# Patient Record
Sex: Male | Born: 1985 | Race: White | Hispanic: No | Marital: Married | Smoking: Never smoker
Health system: Southern US, Community
[De-identification: ages and names within clinical notes are randomized; demographics above are authoritative.]

---

## 2020-12-21 ENCOUNTER — Emergency Department (HOSPITAL_COMMUNITY)
Admission: EM | Admit: 2020-12-21 | Discharge: 2020-12-21 | Disposition: A | Discharge status: Home / Self Care | Attending: Emergency Medicine | Admitting: Emergency Medicine

## 2020-12-21 ENCOUNTER — Other Ambulatory Visit: Payer: Self-pay

## 2020-12-21 ENCOUNTER — Encounter (HOSPITAL_COMMUNITY): Payer: Self-pay | Admitting: Emergency Medicine

## 2020-12-21 ENCOUNTER — Emergency Department (HOSPITAL_COMMUNITY)

## 2020-12-21 DIAGNOSIS — Y93B9 Activity, other involving muscle strengthening exercises: Secondary | ICD-10-CM | POA: Diagnosis not present

## 2020-12-21 DIAGNOSIS — X501XXA Overexertion from prolonged static or awkward postures, initial encounter: Secondary | ICD-10-CM | POA: Diagnosis not present

## 2020-12-21 DIAGNOSIS — S8991XA Unspecified injury of right lower leg, initial encounter: Secondary | ICD-10-CM | POA: Diagnosis present

## 2020-12-21 DIAGNOSIS — S8391XA Sprain of unspecified site of right knee, initial encounter: Secondary | ICD-10-CM | POA: Insufficient documentation

## 2020-12-21 NOTE — ED Provider Notes (Signed)
[facility] EMERGENCY DEPARTMENT Provider Note   CSN: [id number] Arrival date & time: 12/21/20  1003     History Chief Complaint  Patient presents with  . Knee Injury  . Knee Pain    Ryan Bell is a 35 y.o. male.  35 year old male presents with right knee pain.  Patient was doing a drill where he was dragging a heavy sled while walking backwards, stepped on something with his right foot causing him to twist his right knee/valgus stress of the knee resulting in pain around his patella.  Patient is able to bear weight with pain.  No prior injuries to this knee.  No other injuries, complaints, concerns.        History reviewed. No pertinent past medical history.  There are no problems to display for this patient.   History reviewed. No pertinent surgical history.     No family history on file.  Social History   Tobacco Use  . Smoking status: Never Smoker  . Smokeless tobacco: Never Used  Substance Use Topics  . Alcohol use: Not Currently  . Drug use: Not Currently    Home Medications Prior to Admission medications   Not on File    Allergies    Patient has no allergy information on record.  Review of Systems   Review of Systems  Constitutional: Negative for fever.  Musculoskeletal: Positive for arthralgias and myalgias. Negative for joint swelling.  Skin: Negative for color change, rash and wound.  Allergic/Immunologic: Negative for immunocompromised state.  Neurological: Negative for weakness and numbness.    Physical Exam Updated Vital Signs BP 127/83 (BP Location: Right Arm)   Pulse 97   Temp 98.3 F (36.8 C) (Oral)   Resp 16   SpO2 96%   Physical Exam Vitals and nursing note reviewed.  Constitutional:      General: He is not in acute distress.    Appearance: He is well-developed. He is not diaphoretic.  HENT:     Head: Normocephalic and atraumatic.  Cardiovascular:     Pulses: Normal pulses.  Pulmonary:      Effort: Pulmonary effort is normal.  Musculoskeletal:        General: Tenderness present. No swelling or deformity.     Comments: Tenderness with palpation superior and inferior to the right patella as well as somewhat along medial joint line, worse with extension of the knee.  No specific pain or laxity with varus or valgus stress.  Skin:    General: Skin is warm and dry.     Findings: No bruising, erythema or rash.  Neurological:     Mental Status: He is alert and oriented to person, place, and time.     Sensory: No sensory deficit.     Motor: No weakness.  Psychiatric:        Behavior: Behavior normal.     ED Results / Procedures / Treatments   Labs (all labs ordered are listed, but only abnormal results are displayed) Labs Reviewed - No data to display  EKG None  Radiology DG Knee Complete 4 Views Right  Result Date: 12/21/2020 CLINICAL DATA:  Buckling type injury during exercise EXAM: RIGHT KNEE - COMPLETE 4+ VIEW COMPARISON:  None. FINDINGS: Frontal, lateral, and bilateral oblique views obtained. No appreciable fracture or dislocation. No joint effusion. Joint spaces appear normal. No erosive change. IMPRESSION: No fracture, dislocation, or joint effusion. No evident arthropathy. Electronically Signed   By: Bretta Bang III M.D.  On: 12/21/2020 12:21    Procedures Procedures   Medications Ordered in ED Medications - No data to display  ED Course  I have reviewed the triage vital signs and the nursing notes.  Pertinent labs & imaging results that were available during my care of the patient were reviewed by me and considered in my medical decision making (see chart for details).  Clinical Course as of 12/21/20 1229  Sun Dec 21, 2020  [id number] 35 year old male with right knee injury.  On exam has tenderness inferior and superior to the right patella as well as medial knee.  X-ray right knee unremarkable.  Suspect right knee strain.  Referred to orthopedics for  follow-up, not from this area however will require local referral.  Given knee immobilizer, crutches weight-bear as tolerated.  Recommend ice, elevate, Motrin Tylenol. [LM]    Clinical Course User Index [LM] Alden Hipp   MDM Rules/Calculators/A&P                          Final Clinical Impression(s) / ED Diagnoses Final diagnoses:  Sprain of right knee, unspecified ligament, initial encounter    Rx / DC Orders ED Discharge Orders    None       Jeannie Fend, PA-C 12/21/20 1229    Melene Plan, DO 12/21/20 1347

## 2020-12-21 NOTE — ED Triage Notes (Signed)
Pt. Stated, I was going through some exercise training and my knee like buckled, able to bear weight but very uncomfortable.

## 2020-12-21 NOTE — Progress Notes (Signed)
Orthopedic Tech Progress Note Patient Details:  Ryan Bell 03-17-1986 [id number]  Ortho Devices Type of Ortho Device: Knee Immobilizer,Crutches Ortho Device/Splint Location: Right Lower Extremity Ortho Device/Splint Interventions: Ordered,Application,Adjustment   Post Interventions Patient Tolerated: Well Instructions Provided: Adjustment of device,Care of device,Poper ambulation with device   Ryan Bell P [name] 12/21/2020, 1:04 PM

## 2020-12-21 NOTE — Discharge Instructions (Addendum)
Wear knee brace.  Use crutches to weight-bear as tolerated. Apply ice to the knee for 20 minutes at a time and elevate to help with pain and swelling. Given referral to local orthopedist, follow-up with PCP if you need referral closer to home. Take Motrin and Tylenol as needed as directed for pain.

## 2022-06-06 IMAGING — DX DG KNEE COMPLETE 4+V*R*
5 series · 5 of 5 positions shown · non-contrast
Comparison: None.

CLINICAL DATA: Buckling type injury during exercise

EXAM:
RIGHT KNEE - COMPLETE 4+ VIEW

[knee ap]
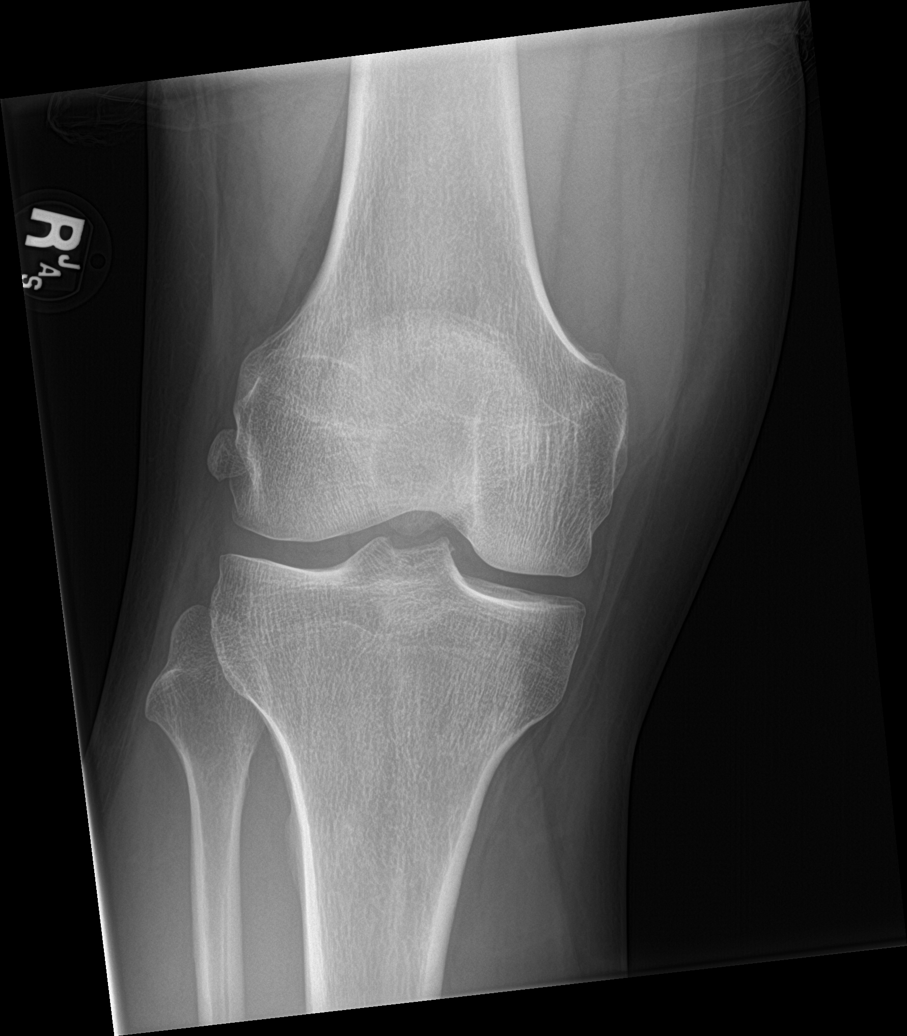

[knee lat]
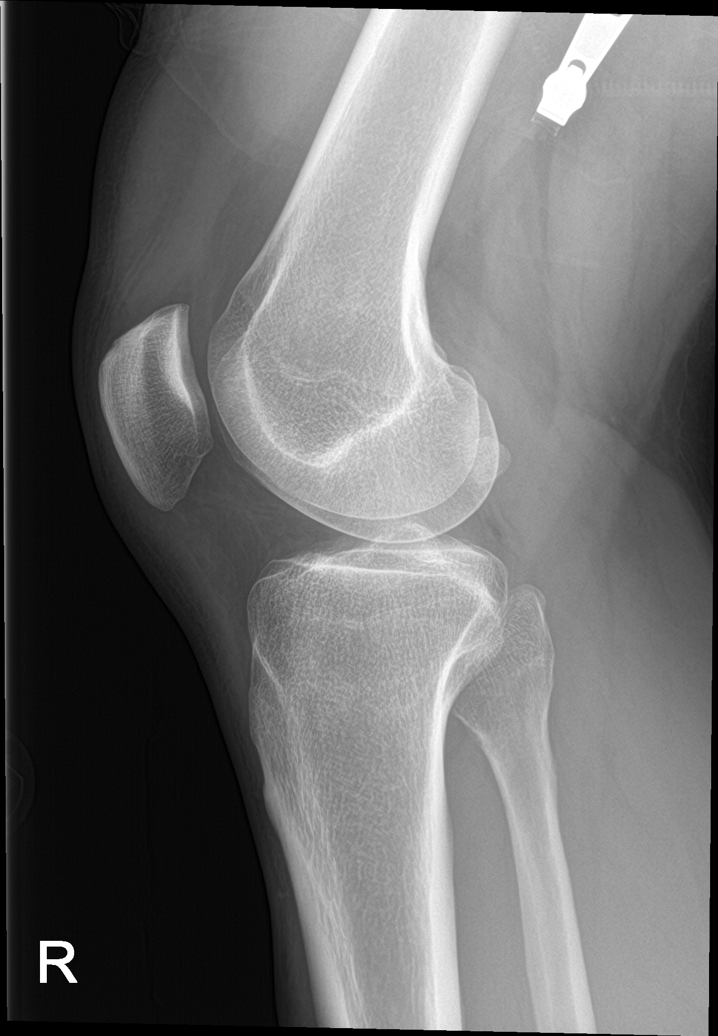

[knee obl (1 of 3)]
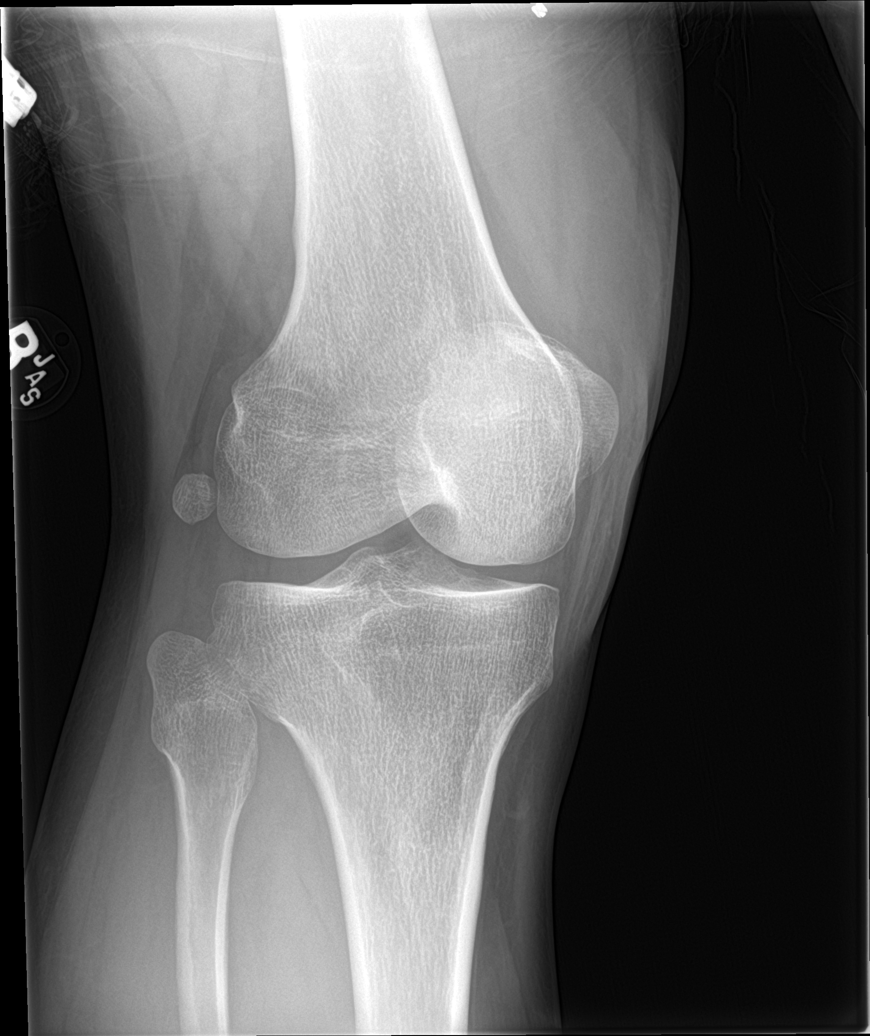

[knee obl (2 of 3)]
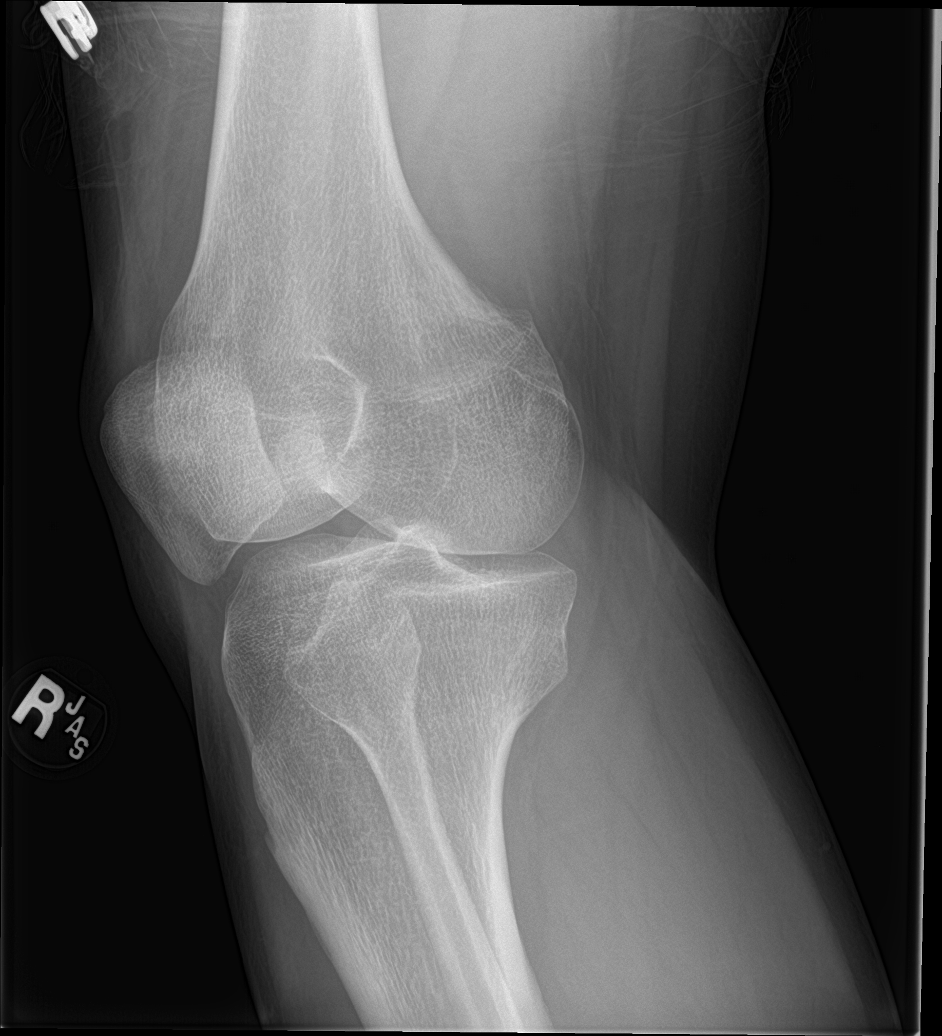

[knee obl (3 of 3)]
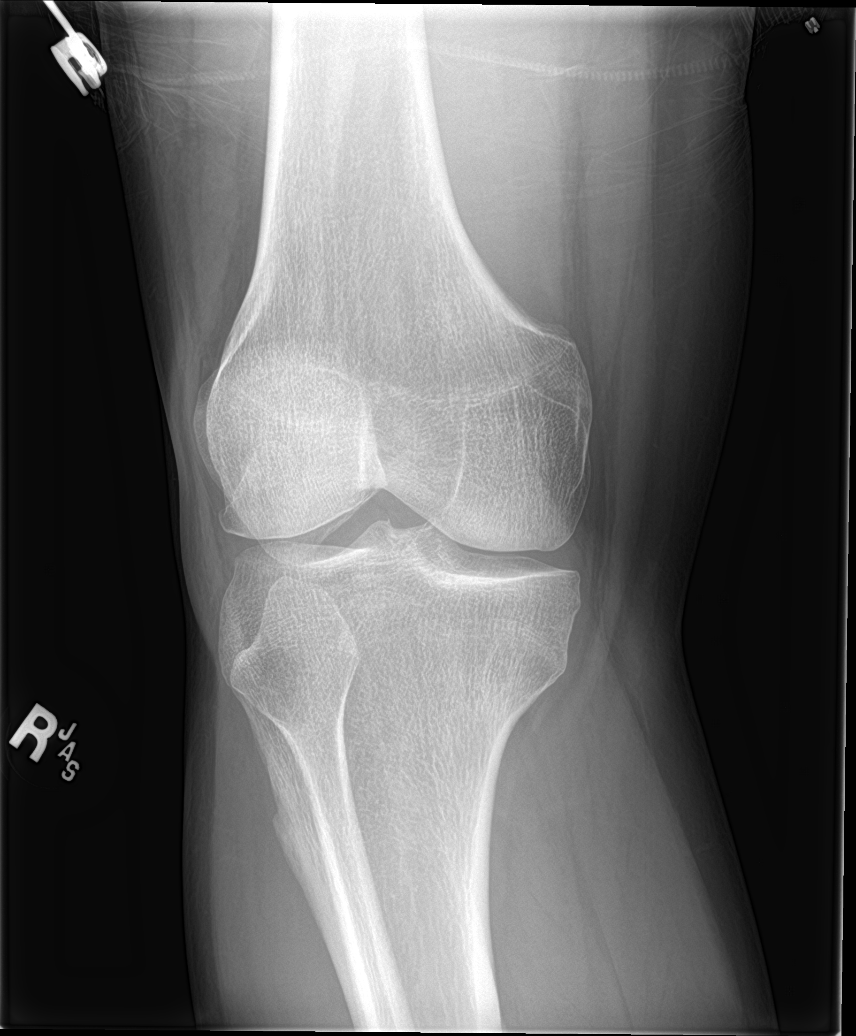

[5 of 5 positions shown; findings below may reference images not displayed]

FINDINGS: Frontal, lateral, and bilateral oblique views obtained. No
appreciable fracture or dislocation. No joint effusion. Joint spaces
appear normal. No erosive change.
IMPRESSION: No fracture, dislocation, or joint effusion. No evident arthropathy.
